# Patient Record
Sex: Female | Born: 1995 | Race: Black or African American | Hispanic: No | Marital: Single | Smoking: Current some day smoker
Health system: Southern US, Community
[De-identification: ages and names within clinical notes are randomized; demographics above are authoritative.]

---

## 2017-04-08 ENCOUNTER — Encounter: Payer: Self-pay | Admitting: Obstetrics & Gynecology

## 2017-04-08 ENCOUNTER — Ambulatory Visit (INDEPENDENT_AMBULATORY_CARE_PROVIDER_SITE_OTHER): Payer: PRIVATE HEALTH INSURANCE | Admitting: Obstetrics & Gynecology

## 2017-04-08 VITALS — BP 127/65 | HR 72 | Ht 65.0 in | Wt 132.5 lb

## 2017-04-08 DIAGNOSIS — N898 Other specified noninflammatory disorders of vagina: Secondary | ICD-10-CM | POA: Diagnosis not present

## 2017-04-08 NOTE — Progress Notes (Signed)
Patient ID: Shelley Burke, female   DOB: 1995-10-01, 22 y.o.   MRN: [id number]  Chief Complaint  Patient presents with  . Gynecologic Exam  vaginal discharge  HPI Shelley Burke is a 22 y.o. female.  [id number] Patient's last menstrual period was 03/27/2017. Pt presents for vaginal STD testing only. Pt c/o "milky" vaginal discharge with occasional itching. Has tried OTC meds with minimal relief. She reports [facility] HPI  History reviewed. No pertinent past medical history.  History reviewed. No pertinent surgical history.  Family History  Problem Relation Age of Onset  . Diabetes Father   . Diabetes Maternal Grandfather   . Hypertension Maternal Grandfather     Social History Social History   Tobacco Use  . Smoking status: Former Games developermoker  . Smokeless tobacco: Never Used  Substance Use Topics  . Alcohol use: No    Frequency: Never  . Drug use: Yes    Types: Marijuana    Not on File  No current outpatient medications on file.   No current facility-administered medications for this visit.     Review of Systems Review of Systems  Constitutional: Negative.   Gastrointestinal: Negative.   Genitourinary: Positive for vaginal discharge. Negative for dyspareunia, dysuria, menstrual problem, pelvic pain and vaginal bleeding.    Blood pressure 127/65, pulse 72, height 5\' 5"  (1.651 m), weight 132 lb 8 oz (60.1 kg), last menstrual period 03/27/2017.  Physical Exam Physical Exam  Constitutional: She is oriented to person, place, and time. She appears well-developed. No distress.  Cardiovascular: Normal rate.  Pulmonary/Chest: Effort normal.  Genitourinary: Uterus normal. Vaginal discharge found.  Genitourinary Comments: No mass or pelvic tenderness  Neurological: She is alert and oriented to person, place, and time.  Skin: Skin is warm and dry.  Psychiatric: She has a normal mood and affect. Her behavior is normal.  Vitals  reviewed.   Data Reviewed   Assessment    Abnormal vaginal discharge    Plan    Vaginitis testing Encouraged to use MyChart Discussed recommended timing for next pap smear Encouraged her to consider more reliable BCM. Currently condoms only       Scheryl DarterJames Whitnee Orzel 04/08/2017, 3:37 PM

## 2017-04-08 NOTE — Patient Instructions (Signed)

## 2017-04-08 NOTE — Progress Notes (Signed)
Pt presents for vaginal STD testing only. Pt c/o "milky" vaginal discharge with occasional itching. Has tried OTC meds with minimal relief. She reports [facility]

## 2017-04-09 LAB — CERVICOVAGINAL ANCILLARY ONLY
Bacterial vaginitis: NEGATIVE
CHLAMYDIA, DNA PROBE: NEGATIVE
Candida vaginitis: POSITIVE — AB
NEISSERIA GONORRHEA: NEGATIVE
TRICH (WINDOWPATH): NEGATIVE

## 2017-04-14 ENCOUNTER — Other Ambulatory Visit: Payer: Self-pay

## 2017-04-14 ENCOUNTER — Ambulatory Visit: Payer: Self-pay | Admitting: Obstetrics & Gynecology

## 2017-04-14 MED ORDER — FLUCONAZOLE 150 MG PO TABS: 150.0000 mg | ORAL_TABLET | Freq: Once | ORAL | 0 refills | Status: AC

## 2017-04-16 ENCOUNTER — Other Ambulatory Visit: Payer: Self-pay | Admitting: Obstetrics & Gynecology

## 2017-04-16 DIAGNOSIS — N898 Other specified noninflammatory disorders of vagina: Secondary | ICD-10-CM

## 2017-04-16 MED ORDER — FLUCONAZOLE 150 MG PO TABS: 150.0000 mg | ORAL_TABLET | Freq: Once | ORAL | 0 refills | Status: AC

## 2017-09-21 ENCOUNTER — Emergency Department (HOSPITAL_COMMUNITY)
Admission: EM | Admit: 2017-09-21 | Discharge: 2017-09-22 | Disposition: A | Discharge status: Home / Self Care | Payer: PRIVATE HEALTH INSURANCE | Attending: Emergency Medicine | Admitting: Emergency Medicine

## 2017-09-21 ENCOUNTER — Other Ambulatory Visit: Payer: Self-pay

## 2017-09-21 ENCOUNTER — Emergency Department (HOSPITAL_COMMUNITY): Payer: PRIVATE HEALTH INSURANCE

## 2017-09-21 ENCOUNTER — Encounter (HOSPITAL_COMMUNITY): Payer: Self-pay | Admitting: Emergency Medicine

## 2017-09-21 DIAGNOSIS — F17210 Nicotine dependence, cigarettes, uncomplicated: Secondary | ICD-10-CM | POA: Diagnosis not present

## 2017-09-21 DIAGNOSIS — R0789 Other chest pain: Secondary | ICD-10-CM | POA: Insufficient documentation

## 2017-09-21 DIAGNOSIS — R079 Chest pain, unspecified: Secondary | ICD-10-CM

## 2017-09-21 LAB — CBC
HCT: 37.5 % (ref 36.0–46.0)
Hemoglobin: 12.1 g/dL (ref 12.0–15.0)
MCH: 27 pg (ref 26.0–34.0)
MCHC: 32.3 g/dL (ref 30.0–36.0)
MCV: 83.7 fL (ref 78.0–100.0)
PLATELETS: 287 10*3/uL (ref 150–400)
RBC: 4.48 MIL/uL (ref 3.87–5.11)
RDW: 14.5 % (ref 11.5–15.5)
WBC: 6.4 10*3/uL (ref 4.0–10.5)

## 2017-09-21 LAB — I-STAT TROPONIN, ED: TROPONIN I, POC: 0 ng/mL (ref 0.00–0.08)

## 2017-09-21 LAB — BASIC METABOLIC PANEL
Anion gap: 11 (ref 5–15)
BUN: 12 mg/dL (ref 6–20)
CALCIUM: 9.2 mg/dL (ref 8.9–10.3)
CO2: 21 mmol/L — ABNORMAL LOW (ref 22–32)
CREATININE: 0.78 mg/dL (ref 0.44–1.00)
Chloride: 105 mmol/L (ref 98–111)
GFR calc Af Amer: >60 mL/min (ref >60)
GFR calc non Af Amer: >60 mL/min (ref >60)
Glucose, Bld: 114 mg/dL — ABNORMAL HIGH (ref 70–99)
Potassium: 3.3 mmol/L — ABNORMAL LOW (ref 3.5–5.1)
SODIUM: 137 mmol/L (ref 135–145)

## 2017-09-21 LAB — I-STAT BETA HCG BLOOD, ED (MC, WL, AP ONLY)

## 2017-09-21 NOTE — ED Triage Notes (Signed)
Pt reports 4/10 left chest pressure that worsened today. Pt states chest and back pain that has been going on for "months." Pt reports she smokes and it has been an issue but is really concerned d/t the severity. Pain worsened upon inspiration.

## 2017-09-22 LAB — D-DIMER, QUANTITATIVE: D-Dimer, Quant: 0.27 ug/mL-FEU (ref 0.00–0.50)

## 2017-09-22 MED ORDER — OMEPRAZOLE 20 MG PO CPDR: 20.0000 mg | DELAYED_RELEASE_CAPSULE | Freq: Every day | ORAL | 0 refills | Status: DC

## 2017-09-22 NOTE — ED Provider Notes (Signed)
[facility] Provider Note   CSN: [id number] Arrival date & time: 09/21/17  2039     History   Chief Complaint Chief Complaint  Patient presents with  . Chest Pain    HPI Shelley Burke is a 22 y.o. female.  Patient here for evaluation of chest and "lung" pain. She describes "lung pain" as sporadic, discomfort to different areas of her chest that is associated with breathing, especially inspiration. This has been going on for at least 6 months. Today she experienced a different pain in her left chest that radiated through to her back. It was worse with breathing but she denies cough or fever. She has felt SOB with exertion today. No nausea, vomiting. She is currently asymptomatic. She has not tried taking anything at home for symptoms. The patient is a tobacco smoker. No history of recent travel, family history of blood clots. She is not on exogenous hormones.   The history is provided by the patient. No language interpreter was used.  Chest Pain   Associated symptoms include shortness of breath. Pertinent negatives include no abdominal pain, no fever, no nausea, no vomiting and no weakness.    History reviewed. No pertinent past medical history.  There are no active problems to display for this patient.   History reviewed. No pertinent surgical history.   OB History    Gravida  0   Para  0   Term  0   Preterm  0   AB  0   Living  0     SAB  0   TAB  0   Ectopic  0   Multiple  0   Live Births  0            Home Medications    Prior to Admission medications   Not on File    Family History Family History  Problem Relation Age of Onset  . Diabetes Father   . Diabetes Maternal Grandfather   . Hypertension Maternal Grandfather     Social History Social History   Tobacco Use  . Smoking status: Current Every Day Smoker    Types: Cigarettes  . Smokeless tobacco: Never Used  . Tobacco comment: 1 cig/day    Substance Use Topics  . Alcohol use: Yes    Frequency: Never    Comment: social  . Drug use: Yes    Types: Marijuana     Allergies   Patient has no allergy information on record.   Review of Systems Review of Systems  Constitutional: Negative for chills and fever.  HENT: Negative.   Respiratory: Positive for shortness of breath.   Cardiovascular: Positive for chest pain.  Gastrointestinal: Negative.  Negative for abdominal pain, nausea and vomiting.  Musculoskeletal: Negative.   Skin: Negative.   Neurological: Negative.  Negative for syncope, weakness and light-headedness.     Physical Exam Updated Vital Signs BP 123/76   Pulse 63   Temp 99.4 F (37.4 C) (Oral)   Resp 18   Ht 5\' 5"  (1.651 m)   Wt 61.2 kg   LMP 09/21/2017   SpO2 100%   BMI 22.47 kg/m   Physical Exam  Constitutional: She is oriented to person, place, and time. She appears well-developed and well-nourished.  HENT:  Head: Normocephalic.  Neck: Normal range of motion. Neck supple.  Cardiovascular: Regular rhythm. Tachycardia present.  Pulmonary/Chest: Effort normal and breath sounds normal. She has no wheezes. She has no rhonchi. She has  no rales. She exhibits no tenderness.  Abdominal: Soft. Bowel sounds are normal. There is no tenderness. There is no rebound and no guarding.  Musculoskeletal: Normal range of motion.  Neurological: She is alert and oriented to person, place, and time.  Skin: Skin is warm and dry. No rash noted.  Psychiatric: She has a normal mood and affect.     ED Treatments / Results  Labs (all labs ordered are listed, but only abnormal results are displayed) Labs Reviewed  BASIC METABOLIC PANEL - Abnormal; Notable for the following components:      Result Value   Potassium 3.3 (*)    CO2 21 (*)    Glucose, Bld 114 (*)    All other components within normal limits  CBC  D-DIMER, QUANTITATIVE ([facility])  I-STAT TROPONIN, ED  I-STAT BETA HCG BLOOD, ED ([facility], [facility], [facility]  ONLY)    EKG None  Radiology Dg Chest 2 View  Result Date: 09/21/2017 CLINICAL DATA:  Initial evaluation for acute chest pain. EXAM: CHEST - 2 VIEW COMPARISON:  None. FINDINGS: The cardiac and mediastinal silhouettes are within normal limits. The lungs are normally inflated. No airspace consolidation, pleural effusion, or pulmonary edema is identified. There is no pneumothorax. No acute osseous abnormality identified. IMPRESSION: No active cardiopulmonary disease. Electronically Signed   By: Rise MuBenjamin  McClintock M.D.   On: 09/21/2017 21:28    Procedures Procedures (including critical care time)  Medications Ordered in ED Medications - No data to display   Initial Impression / Assessment and Plan / ED Course  I have reviewed the triage vital signs and the nursing notes.  Pertinent labs & imaging results that were available during my care of the patient were reviewed by me and considered in my medical decision making (see chart for details).     Patient here with chest pain with SOB today, radiated to back. She is tachycardic on the monitor during my exam. Pain is not reproducible. She has had some degree of symptoms for several months with significantly worse pain today with SOB. She is a smoker and at risk for PE.   D-dimer added to labs.   D-Dimer negative. She is felt appropriate for discharge home and is recommended to find PCP for routine health care.   Final Clinical Impressions(s) / ED Diagnoses   Final diagnoses:  None   1. Nonspecific chest pain  ED Discharge Orders    None       Danne HarborUpstill, Afia Messenger, PA-C 09/22/17 [id number]    Palumbo, April, MD 09/22/17 [id number]

## 2017-09-22 NOTE — Discharge Instructions (Signed)
Take Prilosec daily for the next week to see if this changes your symptoms. Follow up with primary care as needed if symptoms persist.

## 2018-04-12 ENCOUNTER — Encounter: Payer: Self-pay | Admitting: Advanced Practice Midwife

## 2018-04-12 ENCOUNTER — Other Ambulatory Visit: Payer: Self-pay

## 2018-04-12 ENCOUNTER — Ambulatory Visit (INDEPENDENT_AMBULATORY_CARE_PROVIDER_SITE_OTHER): Payer: PRIVATE HEALTH INSURANCE | Admitting: Advanced Practice Midwife

## 2018-04-12 VITALS — BP 113/76 | HR 73 | Ht 65.0 in | Wt 136.0 lb

## 2018-04-12 DIAGNOSIS — N760 Acute vaginitis: Secondary | ICD-10-CM

## 2018-04-12 DIAGNOSIS — N6011 Diffuse cystic mastopathy of right breast: Secondary | ICD-10-CM

## 2018-04-12 DIAGNOSIS — Z01419 Encounter for gynecological examination (general) (routine) without abnormal findings: Secondary | ICD-10-CM

## 2018-04-12 DIAGNOSIS — Z113 Encounter for screening for infections with a predominantly sexual mode of transmission: Secondary | ICD-10-CM

## 2018-04-12 DIAGNOSIS — B9689 Other specified bacterial agents as the cause of diseases classified elsewhere: Secondary | ICD-10-CM

## 2018-04-12 DIAGNOSIS — N898 Other specified noninflammatory disorders of vagina: Secondary | ICD-10-CM

## 2018-04-12 DIAGNOSIS — N6012 Diffuse cystic mastopathy of left breast: Secondary | ICD-10-CM

## 2018-04-12 MED ORDER — METRONIDAZOLE 500 MG PO TABS: 500.0000 mg | ORAL_TABLET | Freq: Two times a day (BID) | ORAL | 0 refills | Status: AC

## 2018-04-12 NOTE — Patient Instructions (Addendum)
Some natural remedies/prevention to try for bacterial vaginosis: --Take a probiotic tablet/capsule every day for at least 1-2 months.   --Whenever you have symptoms, use boric acid suppositories vaginally every night for a week.   --Do not use scented soaps/perfumes in the vaginal area and wear breathable cotton underwear and do not wear tight restrictive clothing. --Use condoms during intercourse   Fibrocystic Breast Changes  Fibrocystic breast changes are changes in breast tissue that can cause breasts to become swollen, lumpy, or painful. This can happen due to buildup of scar-like tissue (fibrous tissue) or the forming of fluid-filled lumps (cysts) in the breast. This is a common condition, and it is not cancerous (is benign). The exact cause is not known, but it seems to occur when women go through hormonal changes during their menstrual cycle. Fibrocystic breast changes can affect one or both breasts. What are the causes? The exact cause of fibrocystic breast changes is not known. However, this condition:  May be related to the female hormones estrogen and progesterone.  May be influenced by family traits that get passed from parent to child (genetics). What are the signs or symptoms? Symptoms of this condition may affect one or both breasts, and may include:  Tenderness, mild discomfort, or pain.  Swelling.  Rope-like tissue that can be felt when touching the breast.  Lumps in one or both breasts.  Changes in breast size. Breasts may get larger before the menstrual period and smaller after the menstrual period.  Green or dark brown discharge from the nipple. Symptoms are usually worse before menstrual periods start, and they get better toward the end of menstrual periods. How is this diagnosed? This condition is diagnosed based on your medical history and a physical exam of your breasts. You may also have tests, such as:  A breast X-ray (mammogram).  Ultrasound of your  breasts.  MRI.  Removal of a breast tissue sample for testing (breast biopsy). This may be done if your health care provider thinks that something else may be causing changes in your breasts. How is this treated? Often, treatment is not needed for this condition. In some cases, treatment may include:  Taking over-the-counter pain relievers to help lessen pain or discomfort.  Limiting or avoiding caffeine. Foods and beverages that contain caffeine include chocolate, soda, coffee, and tea.  Reducing sugar and fat in your diet. Your health care provider may also recommend:  A procedure to remove fluid from a cyst that is causing pain (fine needle aspiration).  Surgery to remove a cyst that is large or tender or does not go away. Follow these instructions at home:  Examine your breasts after every menstrual period. If you do not have menstrual periods, check your breasts on the first day of every month. Feel for changes in your breasts, such as: ? More tenderness. ? A new growth. ? A change in size. ? A change in an existing lump.  Take over-the-counter and prescription medicines only as told by your health care provider.  Wear a well-fitted support or sports bra, especially when exercising.  Decrease or avoid caffeine, fat, and sugar in your diet as directed by your health care provider. Contact a health care provider if:  You have fluid leaking from your nipple, especially if it is bloody.  You have new lumps or bumps in your breast.  Your breast becomes enlarged, red, and painful.  You have areas of your breast that pucker inward.  Your nipple appears flat or  indented. Get help right away if:  You have redness of your breast and the redness is spreading. Summary  Fibrocystic breast changes are changes in breast tissue that can cause breasts to become swollen, lumpy, or painful.  This condition may be related to the female hormones estrogen and progesterone.  With  this condition, it is important to examine your breasts after every menstrual period. If you do not have menstrual periods, check your breasts on the first day of every month. This information is not intended to replace advice given to you by your health care provider. Make sure you discuss any questions you have with your health care provider. Document Released: 11/13/2005 Document Revised: 10/09/2015 Document Reviewed: 09/26/2015 Elsevier Interactive Patient Education  2019 [employer].

## 2018-04-12 NOTE — Progress Notes (Signed)
Subjective:     Shelley Burke is a 23 y.o. female here for a routine exam.  Current complaints: thin clear vaginal discharge with odor.  Personal health questionnaire reviewed: yes.   Gynecologic History No LMP recorded. Contraception: abstinence Last Pap: 2018. Results were: normal   Obstetric History OB History  Gravida Para Term Preterm AB Living  0 0 0 0 0 0  SAB TAB Ectopic Multiple Live Births  0 0 0 0 0     The following portions of the patient's history were reviewed and updated as appropriate: allergies, current medications, past family history, past medical history, past social history, past surgical history and problem list.  Review of Systems Pertinent items noted in HPI and remainder of comprehensive ROS otherwise negative.    Objective:   BP 113/76   Pulse 73   Ht 5\' 5"  (1.651 m)   Wt 61.7 kg   LMP 04/01/2018   BMI 22.63 kg/m    VS reviewed, nursing note reviewed,  Constitutional: well developed, well nourished, no distress HEENT: normocephalic CV: normal rate Pulm/chest wall: normal effort Breast Exam:  right breast normal with some fibrocystic changes, no skin or nipple changes or axillary nodes, left breast normal with some fibrocystic changes, no skin or nipple changes or axillary nodes Abdomen: soft Neuro: alert and oriented x 3 Skin: warm, dry Psych: affect normal Pelvic exam: Cervix pink, visually closed, without lesion, scant white creamy discharge, vaginal walls and external genitalia normal Bimanual exam: Cervix 0/long/high, firm, anterior, neg CMT, uterus nontender, nonenlarged, adnexa without tenderness, enlargement, or mass      Assessment/Plan:   1. Well woman exam with routine gynecological exam - Cytology - PAP( [facility]) - Cervicovaginal ancillary only( [facility])  2. Screening examination for STD (sexually transmitted disease) - HIV Antibody (routine testing w rflx) - RPR - Hepatitis C antibody - Hepatitis B surface  antigen  3. Bacterial vaginosis --Pt exam wnl but pt with hx BV and symptoms today so will treat. Flagyl 500 mg BID x 7 days sent to pharmacy.  4. Vaginal wall cyst --Tiny, 0.25 x 0.25 cm cyst on left lower vaginal wall near introitus.  No pain or other symptoms but pt feels bump there. --Benign, no treatment necessary but will watch. Pt to notify if develops any symptoms.  5. Fibrocystic breast changes of both breasts --Discussed with pt, normal, may cause point tenderness in breasts during menses, which pt does have.  Heat, ibuprofen for tenderness PRN.     Follow up in: 1 year or as needed.   Sharen Counter, CNM 12:26 PM

## 2018-04-13 LAB — HEPATITIS B SURFACE ANTIGEN: HEP B S AG: NEGATIVE

## 2018-04-13 LAB — HIV ANTIBODY (ROUTINE TESTING W REFLEX): HIV SCREEN 4TH GENERATION: NONREACTIVE

## 2018-04-13 LAB — RPR: RPR Ser Ql: NONREACTIVE

## 2018-04-13 LAB — HEPATITIS C ANTIBODY

## 2018-04-15 LAB — CYTOLOGY - PAP: Diagnosis: NEGATIVE

## 2018-04-15 LAB — CERVICOVAGINAL ANCILLARY ONLY
CHLAMYDIA, DNA PROBE: NEGATIVE
NEISSERIA GONORRHEA: NEGATIVE

## 2018-04-20 ENCOUNTER — Telehealth: Payer: Self-pay

## 2018-04-20 NOTE — Telephone Encounter (Signed)
Patient called stating she never received her lab results from last week when she was seen. I advised patient that all of her labs are normal. Pt states she is having symptoms of discharge and odor, I asked patient if she had started taking the flagyl yet that [facility] prescribed her. Pt states she didn't know that [facility] sent any medication. I advise pt to pick up the flagyl and begin taking it as directed and to call back if symptoms do not improve in the next couple of days, pt verbalized understanding.

## 2018-05-03 ENCOUNTER — Ambulatory Visit: Payer: Self-pay

## 2018-05-03 NOTE — Telephone Encounter (Signed)
Pt called and said she has a cough that feels like she has a lot of mucus stuck in the back of her throat.  She states when she get it up it is white. She denies SOB and wheezing. She denies fever and nasal congestion or runny nose. She has not traveled or been in contact with any know COVID-19 patient. Per protocol pt will go to urgent care for evaluation.  She has not established care with [facility] PCP and refused an appointment for care.   Reason for Disposition . [1] Continuous (nonstop) coughing interferes with work or school AND [2] no improvement using cough treatment per Care Advice  Answer Assessment - Initial Assessment Questions 1. ONSET: "When did the cough begin?"      4 days ago 2. SEVERITY: "How bad is the cough today?"      Bad feels like pressure to get mucus out 3. RESPIRATORY DISTRESS: "Describe your breathing."      No 4. FEVER: "Do you have a fever?" If so, ask: "What is your temperature, how was it measured, and when did it start?"    no 5. SPUTUM: "Describe the color of your sputum" (clear, white, yellow, green)    white 6. HEMOPTYSIS: "Are you coughing up any blood?" If so ask: "How much?" (flecks, streaks, tablespoons, etc.)     no 7. CARDIAC HISTORY: "Do you have any history of heart disease?" (e.g., heart attack, congestive heart failure)     no 8. LUNG HISTORY: "Do you have any history of lung disease?"  (e.g., pulmonary embolus, asthma, emphysema)     Prior smoker 9. PE RISK FACTORS: "Do you have a history of blood clots?" (or: recent major surgery, recent prolonged travel, bedridden)    No 10. OTHER SYMPTOMS: "Do you have any other symptoms?" (e.g., runny nose, wheezing, chest pain)      none 11. PREGNANCY: "Is there any chance you are pregnant?" "When was your last menstrual period?"      No 2weeks ago 12. TRAVEL: "Have you traveled out of the country in the last month?" (e.g., travel history, exposures)       No  Protocols used: COUGH -  ACUTE PRODUCTIVE-A-AH

## 2018-08-06 ENCOUNTER — Ambulatory Visit (INDEPENDENT_AMBULATORY_CARE_PROVIDER_SITE_OTHER): Payer: Self-pay

## 2018-08-06 ENCOUNTER — Other Ambulatory Visit: Payer: Self-pay

## 2018-08-06 ENCOUNTER — Other Ambulatory Visit (HOSPITAL_COMMUNITY)
Admission: RE | Admit: 2018-08-06 | Discharge: 2018-08-06 | Disposition: A | Discharge status: Home / Self Care | Payer: Medicaid Other | Source: Ambulatory Visit | Attending: Obstetrics | Admitting: Obstetrics

## 2018-08-06 VITALS — BP 120/74 | HR 68 | Ht 65.0 in | Wt 137.0 lb

## 2018-08-06 DIAGNOSIS — N898 Other specified noninflammatory disorders of vagina: Secondary | ICD-10-CM

## 2018-08-06 DIAGNOSIS — N390 Urinary tract infection, site not specified: Secondary | ICD-10-CM

## 2018-08-06 LAB — POCT URINALYSIS DIPSTICK
Blood, UA: NEGATIVE
Glucose, UA: NEGATIVE
Nitrite, UA: NEGATIVE
Protein, UA: NEGATIVE
Spec Grav, UA: 1.01 (ref 1.010–1.025)
Urobilinogen, UA: 0.2 E.U./dL
pH, UA: 8 (ref 5.0–8.0)

## 2018-08-06 MED ORDER — METRONIDAZOLE 500 MG PO TABS: 500.0000 mg | ORAL_TABLET | Freq: Two times a day (BID) | ORAL | 2 refills | Status: AC

## 2018-08-06 NOTE — Progress Notes (Signed)
SUBJECTIVE:  23 y.o. female complains of white, malodorous vaginal discharge for 2 week(s). Denies abnormal vaginal bleeding or significant pelvic pain or fever. No UTI symptoms. History of known exposure to STD through her partner. Patient requested Dipstick. She was advised that she will get a bill for all these Tests because her Insurance [employer] does not cover.  Patient's last menstrual period was 07/31/2018.  OBJECTIVE:  She appears well, afebrile. Urine dipstick: positive for leukocytes, ketones.  ASSESSMENT:  Vaginal Discharge  Vaginal Odor   PLAN:  GC, chlamydia, trichomonas, BVAG, CVAG probe sent to lab. Treatment: To be determined once lab results are received.  ROV prn if symptoms persist or worsen. Flagyl sent to Pharmacy per Dr. Jodi Mourning.

## 2018-08-10 LAB — CERVICOVAGINAL ANCILLARY ONLY
Bacterial vaginitis: POSITIVE — AB
Candida vaginitis: NEGATIVE
Chlamydia: NEGATIVE
Neisseria Gonorrhea: NEGATIVE
Trichomonas: NEGATIVE

## 2018-08-11 ENCOUNTER — Other Ambulatory Visit: Payer: Self-pay | Admitting: Obstetrics

## 2018-08-23 ENCOUNTER — Encounter: Payer: Self-pay | Admitting: *Deleted

## 2018-08-31 ENCOUNTER — Telehealth: Payer: Self-pay

## 2018-08-31 NOTE — Telephone Encounter (Signed)
Returned call and pt would like to come in for another self swab and urine sample, still having symptoms despite taking 2 rounds of Flagyl. Advised scheduler will contact.

## 2018-09-03 ENCOUNTER — Ambulatory Visit: Payer: Medicaid Other

## 2018-12-20 ENCOUNTER — Telehealth: Payer: Self-pay

## 2018-12-20 ENCOUNTER — Other Ambulatory Visit: Payer: Self-pay

## 2018-12-20 DIAGNOSIS — N898 Other specified noninflammatory disorders of vagina: Secondary | ICD-10-CM

## 2018-12-20 MED ORDER — FLUCONAZOLE 150 MG PO TABS: 150.0000 mg | ORAL_TABLET | Freq: Once | ORAL | 0 refills | Status: AC

## 2018-12-20 NOTE — Telephone Encounter (Signed)
Return call to pt regarding Vaginal yeast sx's  Rx sent per protocol Pt made aware if no relief in 7-10 days to make appt for vaginal swab.

## 2018-12-20 NOTE — Progress Notes (Signed)
Rx sent per protocol  Pt advised if no relief in 7-10 days to Return to office for  Vaginal Swab.

## 2019-07-13 IMAGING — CR DG CHEST 2V
2 series · 2 of 2 positions shown · non-contrast
Comparison: None.

CLINICAL DATA: Initial evaluation for acute chest pain.

EXAM:
CHEST - 2 VIEW

[chest pa]
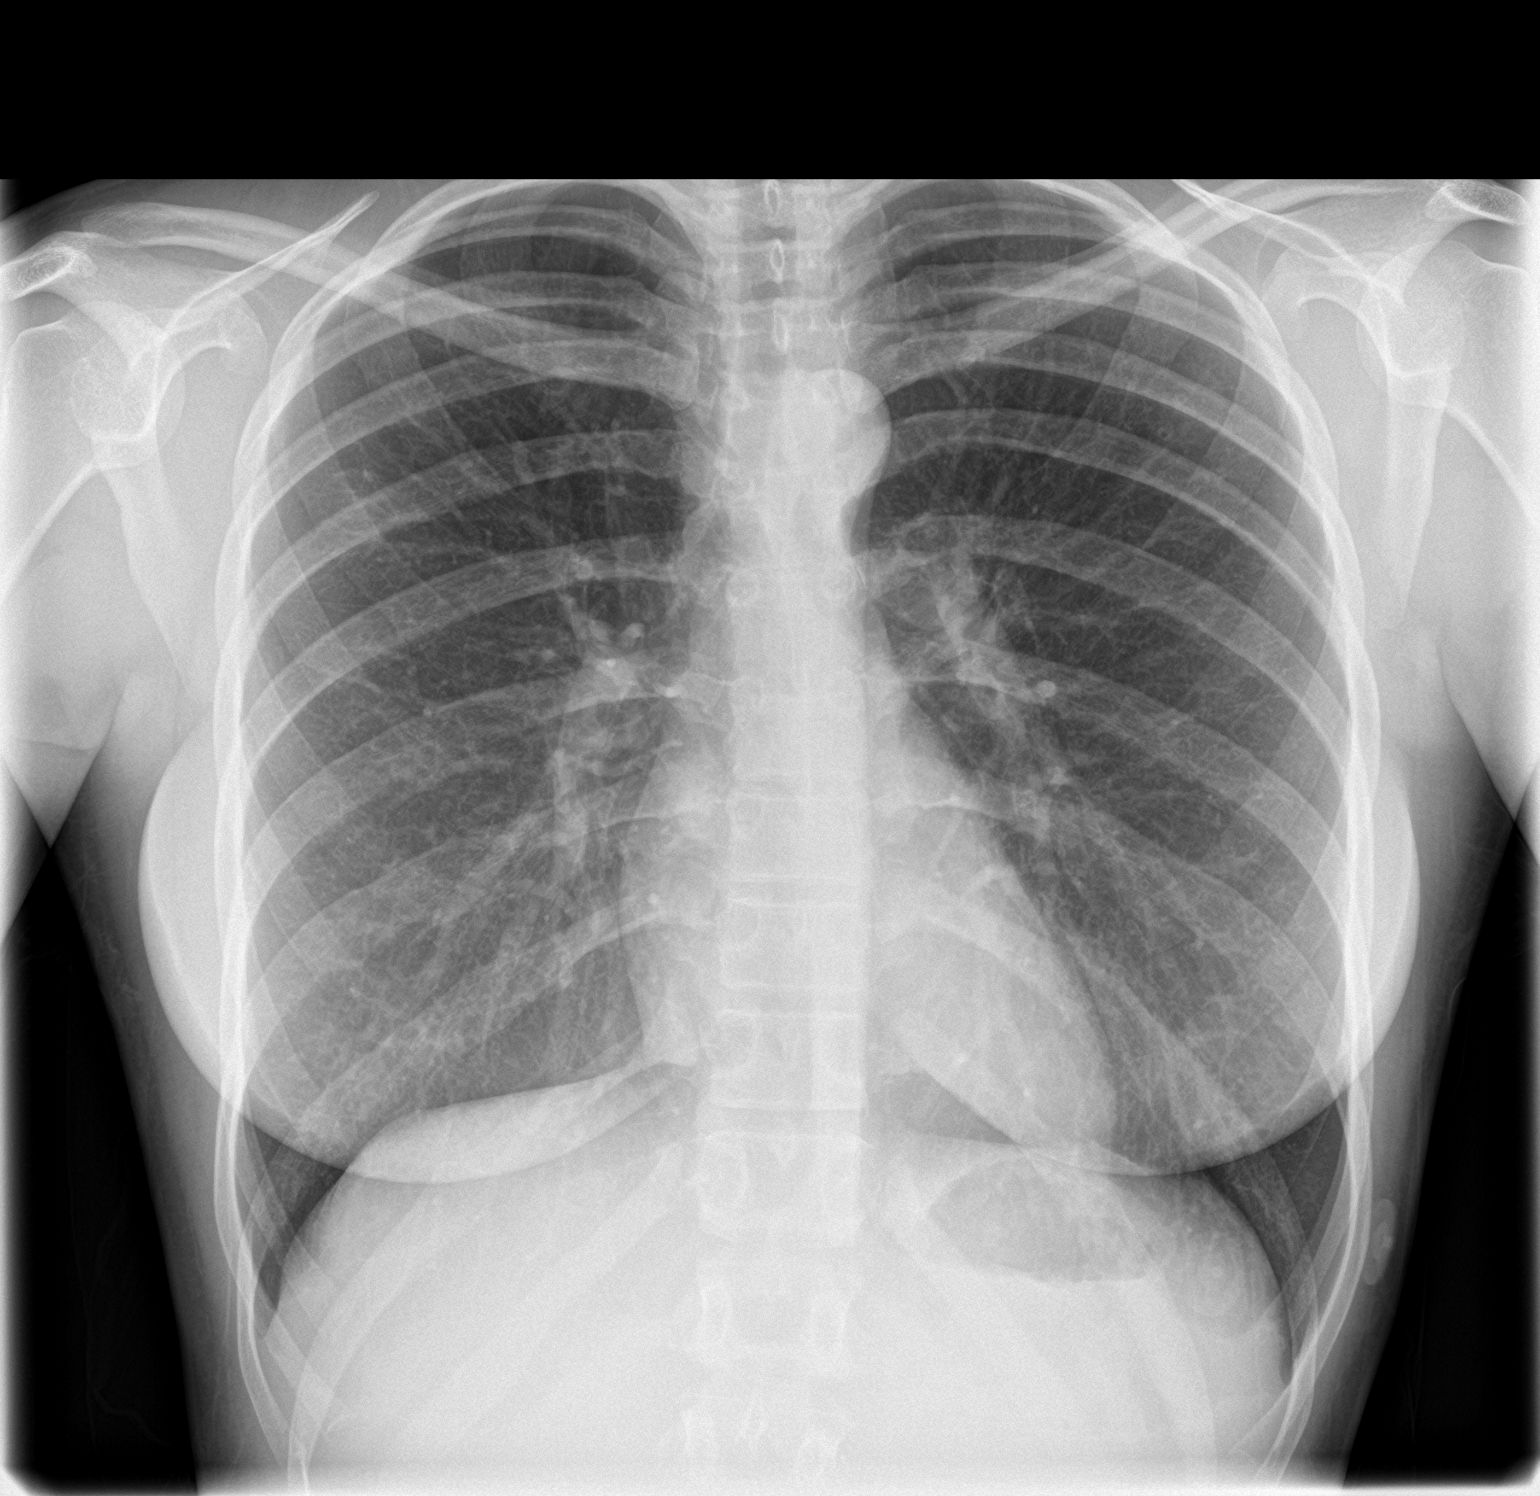

[chest lat]
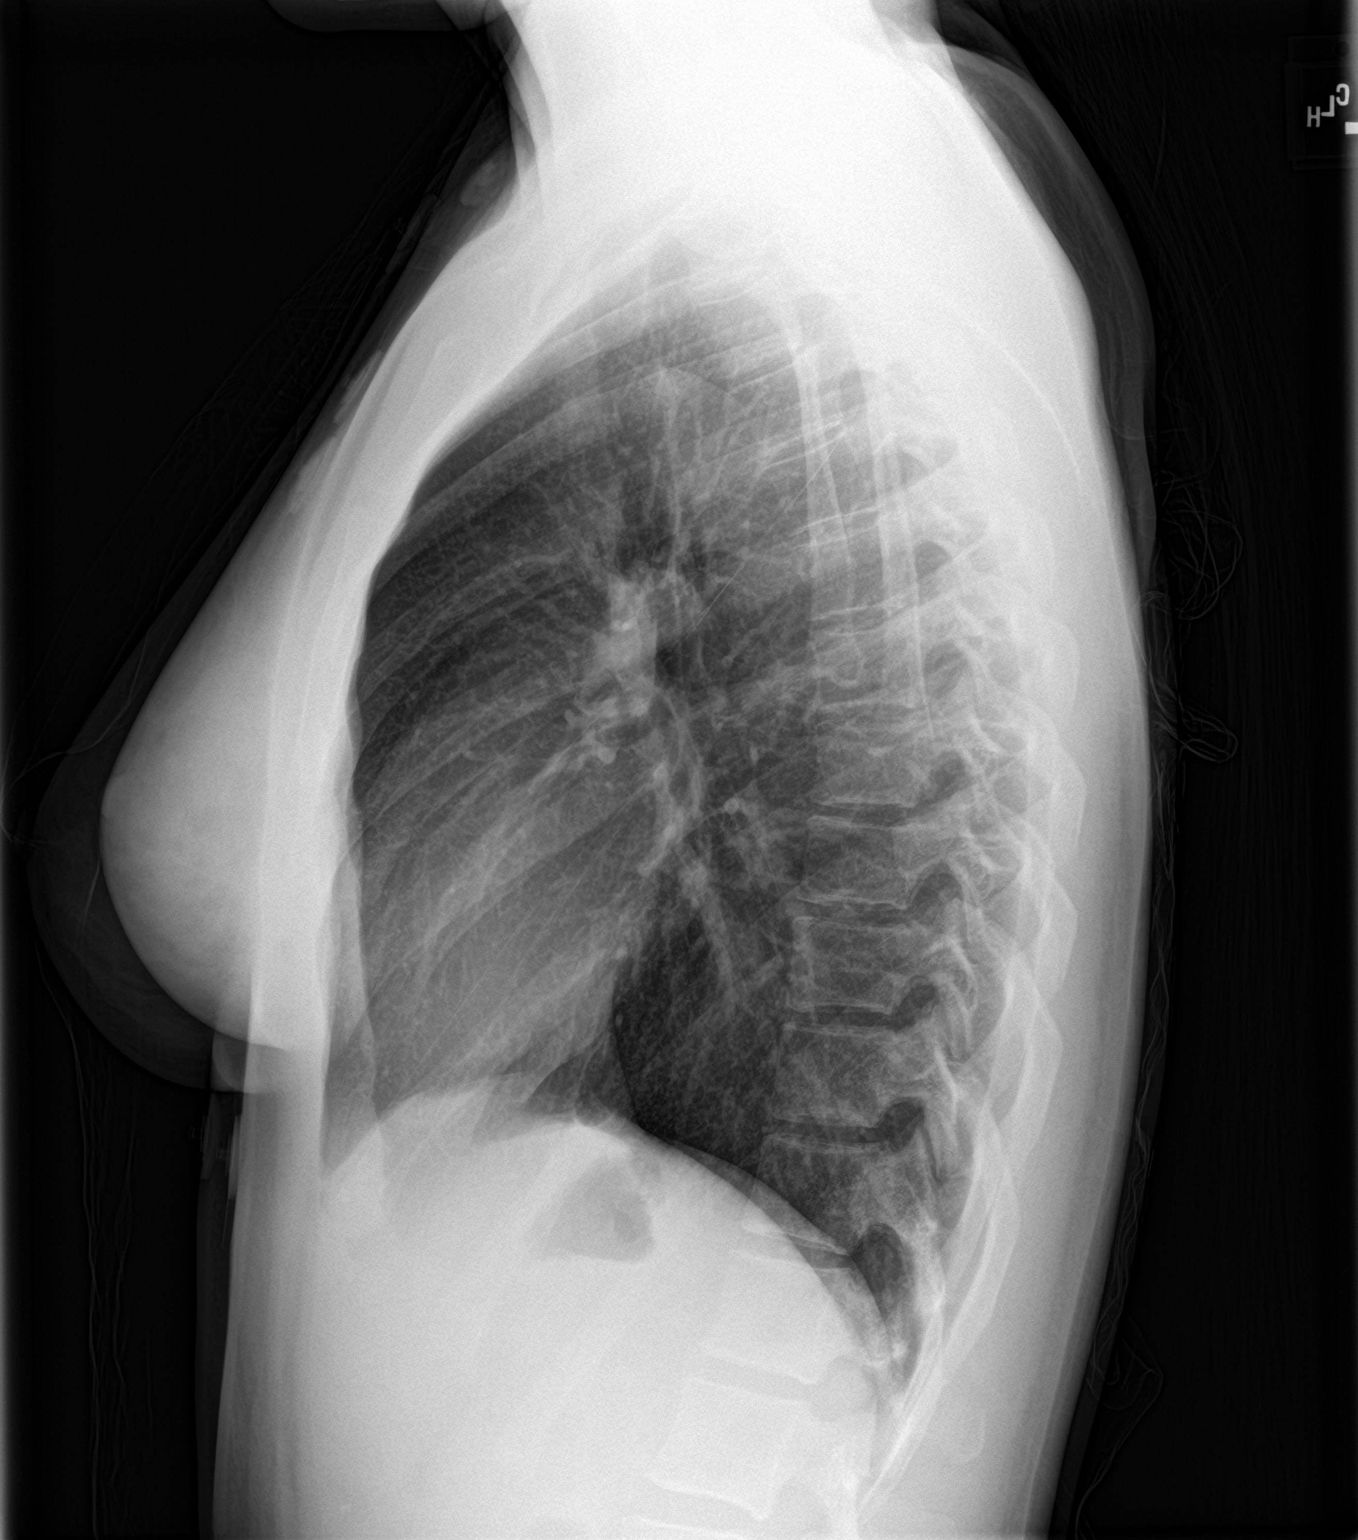

[2 of 2 positions shown; findings below may reference images not displayed]

FINDINGS: The cardiac and mediastinal silhouettes are within normal limits.

The lungs are normally inflated. No airspace consolidation, pleural
effusion, or pulmonary edema is identified. There is no
pneumothorax.

No acute osseous abnormality identified.
IMPRESSION: No active cardiopulmonary disease.
